# Patient Record
Sex: Male | Born: 1990 | Race: Black or African American | Hispanic: No | Marital: Single | Smoking: Current every day smoker
Health system: Southern US, Community
[De-identification: ages and names within clinical notes are randomized; demographics above are authoritative.]

---
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
## 2015-02-16 ENCOUNTER — Emergency Department (HOSPITAL_COMMUNITY): Payer: BLUE CROSS/BLUE SHIELD

## 2015-02-16 ENCOUNTER — Encounter (HOSPITAL_COMMUNITY): Payer: Self-pay | Admitting: Emergency Medicine

## 2015-02-16 ENCOUNTER — Emergency Department (HOSPITAL_COMMUNITY)
Admission: EM | Admit: 2015-02-16 | Discharge: 2015-02-17 | Disposition: A | Discharge status: Home / Self Care | Payer: BLUE CROSS/BLUE SHIELD | Attending: Emergency Medicine | Admitting: Emergency Medicine

## 2015-02-16 DIAGNOSIS — R1030 Lower abdominal pain, unspecified: Secondary | ICD-10-CM | POA: Diagnosis present

## 2015-02-16 DIAGNOSIS — K529 Noninfective gastroenteritis and colitis, unspecified: Secondary | ICD-10-CM | POA: Diagnosis not present

## 2015-02-16 LAB — CBC WITH DIFFERENTIAL/PLATELET
BASOS PCT: 0 % (ref 0–1)
Basophils Absolute: 0 10*3/uL (ref 0.0–0.1)
EOS PCT: 3 % (ref 0–5)
Eosinophils Absolute: 0.4 10*3/uL (ref 0.0–0.7)
HEMATOCRIT: 46.2 % (ref 39.0–52.0)
Hemoglobin: 16.1 g/dL (ref 13.0–17.0)
LYMPHS PCT: 19 % (ref 12–46)
Lymphs Abs: 2.6 10*3/uL (ref 0.7–4.0)
MCH: 32.7 pg (ref 26.0–34.0)
MCHC: 34.8 g/dL (ref 30.0–36.0)
MCV: 93.9 fL (ref 78.0–100.0)
MONOS PCT: 8 % (ref 3–12)
Monocytes Absolute: 1 10*3/uL (ref 0.1–1.0)
NEUTROS ABS: 9.5 10*3/uL — AB (ref 1.7–7.7)
Neutrophils Relative %: 70 % (ref 43–77)
Platelets: 160 10*3/uL (ref 150–400)
RBC: 4.92 MIL/uL (ref 4.22–5.81)
RDW: 11.9 % (ref 11.5–15.5)
WBC: 13.6 10*3/uL — ABNORMAL HIGH (ref 4.0–10.5)

## 2015-02-16 LAB — URINALYSIS, ROUTINE W REFLEX MICROSCOPIC
GLUCOSE, UA: NEGATIVE mg/dL
Hgb urine dipstick: NEGATIVE
Ketones, ur: NEGATIVE mg/dL
Nitrite: NEGATIVE
Protein, ur: NEGATIVE mg/dL
Specific Gravity, Urine: 1.036 — ABNORMAL HIGH (ref 1.005–1.030)
Urobilinogen, UA: 2 mg/dL — ABNORMAL HIGH (ref 0.0–1.0)
pH: 6.5 (ref 5.0–8.0)

## 2015-02-16 LAB — COMPREHENSIVE METABOLIC PANEL
ALBUMIN: 4.7 g/dL (ref 3.5–5.0)
ALT: 9 U/L — AB (ref 17–63)
AST: 19 U/L (ref 15–41)
Alkaline Phosphatase: 60 U/L (ref 38–126)
Anion gap: 8 (ref 5–15)
BUN: 11 mg/dL (ref 6–20)
CALCIUM: 9.5 mg/dL (ref 8.9–10.3)
CO2: 30 mmol/L (ref 22–32)
Chloride: 103 mmol/L (ref 101–111)
Creatinine, Ser: 1.17 mg/dL (ref 0.61–1.24)
GFR calc non Af Amer: >60 mL/min (ref >60)
GLUCOSE: 98 mg/dL (ref 65–99)
POTASSIUM: 4 mmol/L (ref 3.5–5.1)
SODIUM: 141 mmol/L (ref 135–145)
Total Bilirubin: 1.8 mg/dL — ABNORMAL HIGH (ref 0.3–1.2)
Total Protein: 7.7 g/dL (ref 6.5–8.1)

## 2015-02-16 LAB — URINE MICROSCOPIC-ADD ON

## 2015-02-16 LAB — LIPASE, BLOOD: Lipase: 29 U/L (ref 22–51)

## 2015-02-16 MED ORDER — ONDANSETRON HCL 4 MG/2ML IJ SOLN
4.0000 mg | Freq: Once | INTRAMUSCULAR | Status: AC
  Administered 2015-02-16: 4 mg via INTRAVENOUS
  Filled 2015-02-16: qty 2

## 2015-02-16 MED ORDER — IOHEXOL 300 MG/ML  SOLN
100.0000 mL | Freq: Once | INTRAMUSCULAR | Status: AC | PRN
  Administered 2015-02-16: 80 mL via INTRAVENOUS

## 2015-02-16 MED ORDER — SODIUM CHLORIDE 0.9 % IV BOLUS (SEPSIS)
1000.0000 mL | Freq: Once | INTRAVENOUS | Status: AC
  Administered 2015-02-16: 1000 mL via INTRAVENOUS

## 2015-02-16 MED ORDER — MORPHINE SULFATE 4 MG/ML IJ SOLN
4.0000 mg | Freq: Once | INTRAMUSCULAR | Status: AC
Start: 2015-02-16 — End: 2015-02-16
  Administered 2015-02-16: 4 mg via INTRAVENOUS
  Filled 2015-02-16: qty 1

## 2015-02-16 MED ORDER — IOHEXOL 300 MG/ML  SOLN
25.0000 mL | Freq: Once | INTRAMUSCULAR | Status: AC | PRN
Start: 2015-02-16 — End: 2015-02-16
  Administered 2015-02-16: 25 mL via ORAL

## 2015-02-16 NOTE — ED Notes (Signed)
Pt c/o low abd pain x 2 days.  States he has "always had trouble with his stomach" but it has been worse in the last 6 months.  Mom reports noticing pt's weight loss over the last 6 months.

## 2015-02-16 NOTE — ED Provider Notes (Signed)
CSN: [id number]     Arrival date & time 02/16/15  1758 History   First MD Initiated Contact with Patient 02/16/15 2125     Chief Complaint  Patient presents with  . Abdominal Pain     (Consider location/radiation/quality/duration/timing/severity/associated sxs/prior Treatment) Patient is a 24 y.o. male presenting with abdominal pain. The history is provided by the patient, medical records and a parent. No language interpreter was used.  Abdominal Pain Associated symptoms: no chest pain, no constipation, no cough, no diarrhea, no dysuria, no fatigue, no fever, no hematuria, no nausea, no shortness of breath and no vomiting      Pedro Ortiz is a 24 y.o. male  with no major medical problems presents to the Emergency Department complaining of gradual, persistent, waxing and waning lower abdominal pain onset 2 days ago.  Pt states the pain feels like a pressure, rated at a 8/10 and nonradiating.  Pt reports he has been able to eat but has had a decreased appetite.  Nausea without vomiting or diarrhea.  Pt reports that he is lactose intolerant and he still eats some milk which causes some pain.  Mother reports that she believes the patient has lost weight in the last 6 months after moving out of the house.  She reports he has lost approx 10 lbs over the last few months.  Mother reports associated symptoms include rashes to the back of his neck for the last 4-5 months. Sleeping makes his pain better and nothing makes it worse.  Pt denies fever, chills, headache, neck pain, chest pain, SOB, weakness, dysuria, hematuria, penile discharge.  Pt reports 1 male sexual partner in the last 6 months.  He reports a hx of chlamydia in April and it was treated.   Pt reports his last BM was today and it was normal; no change in pain afterwards.     History reviewed. No pertinent past medical history. No past surgical history on file. No family history on file. History  Substance Use Topics  . Smoking  status: Never Smoker   . Smokeless tobacco: Not on file  . Alcohol Use: No    Review of Systems  Constitutional: Negative for fever, diaphoresis, appetite change, fatigue and unexpected weight change.  HENT: Negative for mouth sores.   Eyes: Negative for visual disturbance.  Respiratory: Negative for cough, chest tightness, shortness of breath and wheezing.   Cardiovascular: Negative for chest pain.  Gastrointestinal: Positive for abdominal pain. Negative for nausea, vomiting, diarrhea and constipation.  Endocrine: Negative for polydipsia, polyphagia and polyuria.  Genitourinary: Negative for dysuria, urgency, frequency and hematuria.  Musculoskeletal: Negative for back pain and neck stiffness.  Skin: Negative for rash.  Allergic/Immunologic: Negative for immunocompromised state.  Neurological: Negative for syncope, light-headedness and headaches.  Hematological: Does not bruise/bleed easily.  Psychiatric/Behavioral: Negative for sleep disturbance. The patient is not nervous/anxious.       Allergies  Review of patient's allergies indicates no known allergies.  Home Medications   Prior to Admission medications   Medication Sig Start Date End Date Taking? Authorizing Provider  bismuth subsalicylate (PEPTO BISMOL) 262 MG/15ML suspension Take 30 mLs by mouth every 6 (six) hours as needed for indigestion (indigestion).   Yes Historical Provider, MD  ciprofloxacin (CIPRO) 500 MG tablet Take 1 tablet (500 mg total) by mouth 2 (two) times daily. One po bid x 7 days 02/17/15   Dahlia Client Myiesha Edgar, PA-C  HYDROcodone-acetaminophen (NORCO/VICODIN) 5-325 MG per tablet Take 1-2 tablets by mouth every 6 (  six) hours as needed for moderate pain or severe pain. 02/17/15   Kaycee Mcgaugh, PA-C  metroNIDAZOLE (FLAGYL) 500 MG tablet Take 1 tablet (500 mg total) by mouth 3 (three) times daily. 02/17/15   Sewaren Cohick, PA-C   BP 116/74 mmHg  Pulse 57  Temp(Src) 98.1 F (36.7 C) (Oral)  Resp  16  SpO2 100% Physical Exam  Constitutional: He appears well-developed and well-nourished. No distress.  Awake, alert, nontoxic appearance  HENT:  Head: Normocephalic and atraumatic.  Mouth/Throat: Oropharynx is clear and moist. No oropharyngeal exudate.  Eyes: Conjunctivae are normal. No scleral icterus.  Neck: Normal range of motion. Neck supple.  Cardiovascular: Normal rate, regular rhythm, normal heart sounds and intact distal pulses.   No murmur heard. Pulmonary/Chest: Effort normal and breath sounds normal. No respiratory distress. He has no wheezes.  Equal chest expansion  Abdominal: Soft. Bowel sounds are normal. He exhibits no distension and no mass. There is tenderness in the right lower quadrant, suprapubic area and left lower quadrant. There is guarding. There is no rebound and no CVA tenderness. Hernia confirmed negative in the right inguinal area and confirmed negative in the left inguinal area.  Abdomen soft with guarding in the bilateral lower quadrants and in the suprapubic region No peritoneal signs  Genitourinary: Testes normal and penis normal. Cremasteric reflex is present. Circumcised.  Musculoskeletal: Normal range of motion. He exhibits no edema.  Lymphadenopathy:       Right: No inguinal adenopathy present.       Left: No inguinal adenopathy present.  Neurological: He is alert.  Speech is clear and goal oriented Moves extremities without ataxia  Skin: Skin is warm and dry. He is not diaphoretic.  Psychiatric: He has a normal mood and affect.  Nursing note and vitals reviewed.   ED Course  Procedures (including critical care time) Labs Review Labs Reviewed  CBC WITH DIFFERENTIAL/PLATELET - Abnormal; Notable for the following:    WBC 13.6 (*)    Neutro Abs 9.5 (*)    All other components within normal limits  COMPREHENSIVE METABOLIC PANEL - Abnormal; Notable for the following:    ALT 9 (*)    Total Bilirubin 1.8 (*)    All other components within normal  limits  URINALYSIS, ROUTINE W REFLEX MICROSCOPIC (NOT AT [facility]) - Abnormal; Notable for the following:    Color, Urine AMBER (*)    Specific Gravity, Urine 1.036 (*)    Bilirubin Urine SMALL (*)    Urobilinogen, UA 2.0 (*)    Leukocytes, UA TRACE (*)    All other components within normal limits  URINE MICROSCOPIC-ADD ON - Abnormal; Notable for the following:    Crystals CA OXALATE CRYSTALS (*)    All other components within normal limits  LIPASE, BLOOD    Imaging Review Ct Abdomen Pelvis W Contrast  02/17/2015   CLINICAL DATA:  Acute onset of intermittent mid and lower abdominal pain for 2 days. Nausea. 10 pounds of weight loss over the past 2 months. Initial encounter.  EXAM: CT ABDOMEN AND PELVIS WITH CONTRAST  TECHNIQUE: Multidetector CT imaging of the abdomen and pelvis was performed using the standard protocol following bolus administration of intravenous contrast.  CONTRAST:  80mL OMNIPAQUE IOHEXOL 300 MG/ML  SOLN  COMPARISON:  None.  FINDINGS: The visualized lung bases are clear.  The liver and spleen are unremarkable in appearance. The gallbladder is within normal limits. The pancreas and adrenal glands are unremarkable.  The kidneys are unremarkable in appearance.  There is no evidence of hydronephrosis. No renal or ureteral stones are seen. No perinephric stranding is appreciated.  No free fluid is identified. The small bowel is unremarkable in appearance. The stomach is within normal limits. No acute vascular abnormalities are seen.  The appendix contains contrast and air, and is unremarkable in appearance. There is no evidence for appendicitis.  There is significant mucosal edema along the cecum and proximal ascending colon, raising concern for acute infectious or inflammatory colitis. The remainder of the colon is unremarkable in appearance.  The bladder is mildly distended and grossly unremarkable. The prostate remains normal in size. No inguinal lymphadenopathy is seen.  No acute osseous  abnormalities are identified.  IMPRESSION: Significant mucosal edema along the cecum and proximal ascending colon, raising concern for an acute infectious or inflammatory colitis. Malignancy is considered very unlikely given the patient's age, though would correlate with the patient's symptoms. Colonoscopy could be considered if deemed clinically appropriate.   Electronically Signed   By: Roanna Raider M.D.   On: 02/17/2015 00:55     EKG Interpretation None      MDM   Final diagnoses:  Lower abdominal pain  Colitis   Pedro Ortiz presents with lower abdomainl pain x2 days with nausea and anorexia.  Patient with guarding on exam in the lower quadrants. No rebound or peritoneal signs. Labs are reassuring however patient does have leukocytosis. Will team CT scan to rule out appendicitis. No evidence of urinary tract infection. Normal genital exam.  1:12 AM CT with mucosal edema along the cecum and proximal ascending colon, raising concern for an acute infectious or inflammatory colitis.  With patient's history of weight loss and intermittent longstanding abd discomfort, question possible diagnosis of Crohn's.  Pt will need GI follow-up.  Will be d/c home with Vicodin, Cipro and flagyl.    Patient is nontoxic, nonseptic appearing, in no apparent distress.  Patient's pain and other symptoms adequately managed in emergency department.  Fluid bolus given.  Labs, imaging and vitals reviewed.  Patient does not meet the SIRS or Sepsis criteria.  On repeat exam patient does not have a surgical abdomin and there are no peritoneal signs.  No indication of appendicitis, bowel obstruction, bowel perforation, cholecystitis, diverticulitis.  Patient and mother expresses understanding and agrees with plan.  BP 116/74 mmHg  Pulse 57  Temp(Src) 98.1 F (36.7 C) (Oral)  Resp 16  SpO2 100%     Dierdre Forth, PA-C 02/17/15 0115  Pricilla Loveless, MD 02/21/15 1437

## 2015-02-16 NOTE — ED Notes (Addendum)
Pt c/o intermittent middle and lower abdominal pain x 2days.  Pt states he has nausea but denies vomiting.  Pt denies bowel and urinary complaints.  Pt states he has had intermittent problems with stomach pain for many years and was diagnosed as lactose intolerant.  Pt's mother states pt has lost approx 10lbs over the past 30mos.  Pt reports he has been eating less recently. Mother also reports pt has "been breaking out in a lot of rashes recently".

## 2015-02-17 ENCOUNTER — Telehealth: Payer: Self-pay | Admitting: Internal Medicine

## 2015-02-17 MED ORDER — METRONIDAZOLE 500 MG PO TABS
500.0000 mg | ORAL_TABLET | Freq: Once | ORAL | Status: AC
  Administered 2015-02-17: 500 mg via ORAL
  Filled 2015-02-17: qty 1

## 2015-02-17 MED ORDER — METRONIDAZOLE 500 MG PO TABS: 500.0000 mg | ORAL_TABLET | Freq: Three times a day (TID) | ORAL | Status: DC

## 2015-02-17 MED ORDER — HYDROCODONE-ACETAMINOPHEN 5-325 MG PO TABS: 1.0000 | ORAL_TABLET | Freq: Four times a day (QID) | ORAL | Status: DC | PRN

## 2015-02-17 MED ORDER — CIPROFLOXACIN HCL 500 MG PO TABS
500.0000 mg | ORAL_TABLET | Freq: Once | ORAL | Status: AC
  Administered 2015-02-17: 500 mg via ORAL
  Filled 2015-02-17: qty 1

## 2015-02-17 MED ORDER — CIPROFLOXACIN HCL 500 MG PO TABS: 500.0000 mg | ORAL_TABLET | Freq: Two times a day (BID) | ORAL | Status: DC

## 2015-02-17 NOTE — Telephone Encounter (Signed)
Pt scheduled to see Amy Esterwood PA 03/02/15@3pm . Pt aware of appt.

## 2015-02-17 NOTE — Discharge Instructions (Signed)
1. Medications: cipro, flagyl, vicodin, usual home medications 2. Treatment: rest, drink plenty of fluids, advance diet slowly 3. Follow Up: Please followup with your primary doctor in 2 days for discussion of your diagnoses and further evaluation after today's visit; if you do not have a primary care doctor use the resource guide provided to find one; Please return to the ER for persistent vomiting, high fevers or worsening symptoms  Colitis Colitis is inflammation of the colon. Colitis can be a short-term or long-standing (chronic) illness. Crohn's disease and ulcerative colitis are 2 types of colitis which are chronic. They usually require lifelong treatment. CAUSES  There are many different causes of colitis, including:  Viruses.  Germs (bacteria).  Medicine reactions. SYMPTOMS   Diarrhea.  Intestinal bleeding.  Pain.  Fever.  Throwing up (vomiting).  Tiredness (fatigue).  Weight loss.  Bowel blockage. DIAGNOSIS  The diagnosis of colitis is based on examination and stool or blood tests. X-rays, CT scan, and colonoscopy may also be needed. TREATMENT  Treatment may include:  Fluids given through the vein (intravenously).  Bowel rest (nothing to eat or drink for a period of time).  Medicine for pain and diarrhea.  Medicines (antibiotics) that kill germs.  Cortisone medicines.  Surgery. HOME CARE INSTRUCTIONS   Get plenty of rest.  Drink enough water and fluids to keep your urine clear or pale yellow.  Eat a well-balanced diet.  Call your caregiver for follow-up as recommended. SEEK IMMEDIATE MEDICAL CARE IF:   You develop chills.  You have an oral temperature above 102 F (38.9 C), not controlled by medicine.  You have extreme weakness, fainting, or dehydration.  You have repeated vomiting.  You develop severe belly (abdominal) pain or are passing bloody or tarry stools. MAKE SURE YOU:   Understand these instructions.  Will watch your  condition.  Will get help right away if you are not doing well or get worse. Document Released: 09/19/2004 Document Revised: 11/04/2011 Document Reviewed: 12/15/2009 [facility] Patient Information 2015 [place], [place]. This information is not intended to replace advice given to you by your health care provider. Make sure you discuss any questions you have with your health care provider.    Emergency Department Resource Guide 1) Find a Doctor and Pay Out of Pocket Although you won't have to find out who is covered by your insurance plan, it is a good idea to ask around and get recommendations. You will then need to call the office and see if the doctor you have chosen will accept you as a new patient and what types of options they offer for patients who are self-pay. Some doctors offer discounts or will set up payment plans for their patients who do not have insurance, but you will need to ask so you aren't surprised when you get to your appointment.  2) Contact Your Local Health Department Not all health departments have doctors that can see patients for sick visits, but many do, so it is worth a call to see if yours does. If you don't know where your local health department is, you can check in your phone book. The CDC also has a tool to help you locate your state's health department, and many state websites also have listings of all of their local health departments.  3) Find a Walk-in Clinic If your illness is not likely to be very severe or complicated, you may want to try a walk in clinic. These are popping up all over the country in  pharmacies, drugstores, and shopping centers. They're usually staffed by nurse practitioners or physician assistants that have been trained to treat common illnesses and complaints. They're usually fairly quick and inexpensive. However, if you have serious medical issues or chronic medical problems, these are probably not your best option.  No Primary Care  Doctor: - Call Health Connect at  [phone] - they can help you locate a primary care doctor that  accepts your insurance, provides certain services, etc. - Physician Referral Service- [phone]  Chronic Pain Problems: Organization         Address  Phone   Notes  [facility]  [phone] Patients need to be referred by their primary care doctor.   Medication Assistance: Organization         Address  Phone   Notes  [facility] Medication [facility] [address] [place] [place], [place] [address] [phone] --Must be a resident of [facility] -- Must have NO insurance coverage whatsoever (no Medicaid/ Medicare, etc.) -- The pt. MUST have a primary care doctor that directs their care regularly and follows them in the community   [facility]  [phone]   [employer]  [phone]    Agencies that provide inexpensive medical care: Organization         Address  Phone   Notes  [facility]  [phone]   [facility]    [phone]   [facility] [address] [place], [place] [address] [phone]   [facility] [address] [place] [place] [phone]   Planned Parenthood    [phone]   [facility]    [phone]   [facility]  [address], [place] Phone:  [phone], Fax:  [phone] Hours of Operation:  9 am - 6 pm, M-F.  Also accepts Medicaid/Medicare and self-pay.  [facility]  [address], [place] Phone: [phone], Fax: [phone]. Hours of Operation:  8:30 am - 5:30 pm, M-F.  Also accepts Medicaid and self-pay.  [facility] [place] [address] [place] Phone: [phone]   [facility] [address] Natasha Bence [place], [place] [phone], Ext. [phone] Mondays & Thursdays: 7-9 AM.  First 15 patients are seen on a first  come, first serve basis.    Medicaid-accepting [facility] Providers:  Organization         Address  Phone   Notes  [facility] [address], [place] [phone] Also accepts self-pay patients.  [facility] [address] Laurell Josephs [place], [place]  [phone]   [facility] [address], [place] [phone]   [facility] [address], [place] [phone]   Renaye Rakers [address], [place]   [phone] Only accepts [place] patients after they have their name applied to their card.   Self-Pay (no insurance) in [facility]:  Organization         Address  Phone   Notes  Sickle Cell Patients, [facility] [address] [place], [place] [phone]   [facility] [address] [place], [place] [phone]   [facility] [place]  [address],  [place] [phone]   [facility]/Dr. Osei-Bonsu  [address], [place] or [address], [place] [phone] Phone number for both [place] and [place] locations is the same.  [facility] [address] [place] [phone]   [facility] [address] [place] or [address] [phone]   [facility] [address] [place] [phone], phone; [phone], fax Sees patients 1st and 3rd Saturday of every month.  Must not qualify for public or private insurance (i.e. Medicaid, Medicare, [place] Health Choice, Veterans' Benefits)  Household income should be no more than 200% of the poverty level The clinic cannot treat you if you are pregnant or think you are pregnant  Sexually transmitted diseases are not treated at the clinic.    Dental Care: Organization          Address  Phone  Notes  [facility] Department of [facility] [address] [place], [place] [phone] Accepts children up to age 36 who are enrolled in [place] or [place] Health Choice; pregnant women with a Medicaid card; and children who have applied for Medicaid or [place] Health Choice, but were declined, whose parents can pay a reduced fee at time of service.  [facility] Department of [facility]  [address], [place] [phone] Accepts children up to age 14 who are enrolled in [place] or [place] Health Choice; pregnant women with a Medicaid card; and children who have applied for Medicaid or  Health Choice, but were declined, whose parents can pay a reduced fee at time of service.  [facility]  [address] [place], [place] [phone] Patients are seen by appointment only. Walk-ins are not accepted. [facility] will see patients 47 years of age and older. Monday - Tuesday (8am-5pm) Most Wednesdays (8:30-5pm) $30 per visit, cash only  [facility]  [address], [facility] [phone] Patients are seen by appointment only. Walk-ins are not accepted. [facility] will see patients 29 years of age and older. One Wednesday Evening (Monthly: Volunteer Based).  $30 per visit, cash only  [employer]  [phone] for adults; Children under age 10, call Graduate Pediatric Dentistry at [phone]. Children aged 10-14, please call [phone] to request a pediatric application.  Dental services are provided in all areas of dental care including fillings, crowns and bridges, complete and partial dentures, implants, gum treatment, root canals, and extractions. Preventive care is also provided. Treatment is provided to both adults and children. Patients are selected via a lottery and there is often a waiting list.   [facility] [address] [place]  [phone] [email or web address]   [facility] [address] [place], [place] [phone], Ext. [phone] Second and Fourth Thursday of each month, opens at 6:30 AM; Clinic ends at 9 AM.  Patients are seen on a first-come first-served basis, and a limited number are seen during each clinic.   [facility]  [address] [place], [place] [phone]   Eligibility Requirements You must have lived in [place], [place], or [place] counties for at least the last three months.   You cannot be eligible for state or federal sponsored [place], including [employer], [place], or [employer].   You generally cannot  be eligible for healthcare insurance through your employer.    How to apply: Eligibility screenings are held every Tuesday and Wednesday afternoon from 1:00 pm until 4:00 pm. You do not need an appointment for the interview!  [facility] [address], [place], [place] [phone]   [facility]  [phone]   [facility]  [phone]   [facility]  [phone]    Behavioral Health Resources in the Community: Intensive Outpatient Programs Organization         Address  Phone  Notes  [facility] [address], [place], [place] [phone]   [facility] [address], [place], [place] [phone]   [facility] [address], [place], [place]  [phone]   [facility] [address],  [place], [place] [phone] or [phone]   Substance Abuse Resources Organization         Address  Phone  Notes  [facility]  [phone]   [facility]  [phone]   [facility]  [phone]   [facility]  [phone]   Residential & Outpatient Substance Abuse Program  [phone]   Psychological  Services Organization         Address  Phone  Notes  [facility]  [phone]- [phone]   [facility]  [phone]- [phone]   [facility] [address], [place] [phone] or [phone]    Mobile Crisis Teams Organization         Address  Phone  Notes  [facility], Mobile Crisis Care Unit  [phone]   [facility]  [address] [place], [place] [phone]   Doristine LocksSharon DeEsch [address] [place] [phone]    Self-Help/Support Groups Organization         Address  Phone             Notes  [facility] [place] - variety of support groups  [phone] Call for more information  Narcotics Anonymous (NA), [facility] [address], [place]  2 meetings at this location   Statisticianesidential Treatment Programs Organization         Address  Phone  Notes  [facility] [address],    [place]  [phone]   [facility]  [address], [place] [phone], [place], [place] [phone]   [facility] [address], [place] [phone] Admissions: 8am-3pm M-F  [facility] [address]    [place], [place] [phone]   [facility] [address], [place], [place] [phone]   [facility] [address]  [place], [place] [phone]   [facility] - Intensive Outpatient [address], [place], [place] [phone]   [facility] ([facility] [address]  [place], [place] [phone] or [phone]   [facility] ([facility]) [address] [place], [place] [phone] Accepts Medicaid  [facility] [address]  [place] [phone] Substance Abuse/Addiction Treatment   [facility] Resources Organization         Address  Phone  Notes  [facility]  [phone]  [phone]   Angie FavaJulie Brannon, PhD [address] [place] [place], [place]   [phone] or [phone]   [facility]   [address]  [place], [place] [phone]   [facility] [address] [place], [place] [phone] Insurance/Medicaid/sponsorship through [facility] and Families [address], [address] [place], [place] [phone] [place], [place] [phone]    Dr. Adele Schilder  [phone]   [facility]. 1) [address], [place] 2) [place] 3)  [place] [address], [place] [phone]  [phone]   [place] [phone] or [phone] (After Hours)

## 2015-03-02 ENCOUNTER — Encounter: Payer: Self-pay | Admitting: Physician Assistant

## 2015-03-02 ENCOUNTER — Ambulatory Visit (INDEPENDENT_AMBULATORY_CARE_PROVIDER_SITE_OTHER): Payer: BLUE CROSS/BLUE SHIELD | Admitting: Physician Assistant

## 2015-03-02 ENCOUNTER — Other Ambulatory Visit (INDEPENDENT_AMBULATORY_CARE_PROVIDER_SITE_OTHER): Payer: BLUE CROSS/BLUE SHIELD

## 2015-03-02 VITALS — BP 90/60 | HR 80 | Ht 61.75 in | Wt 110.5 lb

## 2015-03-02 DIAGNOSIS — R634 Abnormal weight loss: Secondary | ICD-10-CM

## 2015-03-02 DIAGNOSIS — R933 Abnormal findings on diagnostic imaging of other parts of digestive tract: Secondary | ICD-10-CM

## 2015-03-02 DIAGNOSIS — R1031 Right lower quadrant pain: Secondary | ICD-10-CM | POA: Diagnosis not present

## 2015-03-02 LAB — CBC WITH DIFFERENTIAL/PLATELET
BASOS ABS: 0 10*3/uL (ref 0.0–0.1)
Basophils Relative: 0.7 % (ref 0.0–3.0)
EOS ABS: 0.5 10*3/uL (ref 0.0–0.7)
Eosinophils Relative: 8.9 % — ABNORMAL HIGH (ref 0.0–5.0)
HCT: 45.2 % (ref 39.0–52.0)
Hemoglobin: 15.2 g/dL (ref 13.0–17.0)
LYMPHS ABS: 2.2 10*3/uL (ref 0.7–4.0)
LYMPHS PCT: 40.1 % (ref 12.0–46.0)
MCHC: 33.6 g/dL (ref 30.0–36.0)
MCV: 94.7 fl (ref 78.0–100.0)
Monocytes Absolute: 0.5 10*3/uL (ref 0.1–1.0)
Monocytes Relative: 8.4 % (ref 3.0–12.0)
Neutro Abs: 2.3 10*3/uL (ref 1.4–7.7)
Neutrophils Relative %: 41.9 % — ABNORMAL LOW (ref 43.0–77.0)
Platelets: 211 10*3/uL (ref 150.0–400.0)
RBC: 4.77 Mil/uL (ref 4.22–5.81)
RDW: 12.5 % (ref 11.5–15.5)
WBC: 5.6 10*3/uL (ref 4.0–10.5)

## 2015-03-02 LAB — SEDIMENTATION RATE: Sed Rate: 0 mm/hr (ref 0–22)

## 2015-03-02 LAB — HIGH SENSITIVITY CRP: CRP, High Sensitivity: 0.18 mg/L (ref 0.000–5.000)

## 2015-03-02 NOTE — Patient Instructions (Addendum)
Please go to the basement level to have your labs drawn. You have been scheduled for a colonoscopy. Please follow written instructions given to you at your visit today.  Please pick up your prep supplies at the pharmacy or [facility].  These are over the counter items. Dulcolax, Miralax and Gatorade.  If you use inhalers (even only as needed), please bring them with you on the day of your procedure. Your physician has requested that you go to www.startemmi.com and enter the access code given to you at your visit today. This web site gives a general overview about your procedure. However, you should still follow specific instructions given to you by our office regarding your preparation for the procedure.

## 2015-03-02 NOTE — Progress Notes (Addendum)
Patient ID: Pedro Ortiz, male   DOB: June 05, 1991, 24 y.o.   MRN: [id number]   Subjective:    Patient ID: Pedro Ortiz, male    DOB: 1990/09/10, 24 y.o.   MRN: [id number]  HPI  "Pedro Ortiz"  Is a 24 year old African-American male new to GI today referred by the emergency room after recent visit. He has generally been in good health with no known chronic medical problems. He had an ER visit on 6 24 2016 with acute severe abdominal pain.Marland Kitchen He said he been having fairly bad pain for couple of days prior to that visit. CT was done which showed the small bowel to be unremarkable but he had significant edema of the cecum and proximal ascending colon consistent with an infectious versus inflammatory colitis. The WBC was 13.6, hemoglobin 16 hematocrit of 46. He was given a course of Cipro and Flagyl which she has now completed.  He feels that the medications did help him however he is still having right-sided abdominal pain. This over the past year or so he has had right-sided abdominal discomfort which usually bothers him when he gets up in the morning and sometimes is worse if he doesn't eat anything. Generally he's has no nausea or vomiting however his appetite has been decreased and he has lost about 25-30 pounds over the past 4 months. He says he can't eat larger meals probably is eating last because he is uncomfortable after eating. Lumens of been fairly regular denies problems with ongoing diarrhea and has not noted any melena or hematochezia. He denies any problems with fever chills or sweats.   his mother states that he is lactose intolerant , and has had "stomach problems since he was a child. She says as a teenager he would complain of abdominal pain intermittently. It never had any consistent problems with right-sided pain or any weight loss in the past. Patient says his normal weight is a about 150. Pedro Ortiz Family history is negative for GI diseases far as they're aware.  Review of Systems Pertinent  positive and negative review of systems were noted in the above HPI section.  All other review of systems was otherwise negative.  Outpatient Encounter Prescriptions as of 03/02/2015  Medication Sig  . acetaminophen (TYLENOL) 500 MG tablet Take 500 mg by mouth every 6 (six) hours as needed.  Marland Kitchen HYDROcodone-acetaminophen (NORCO/VICODIN) 5-325 MG per tablet Take 1-2 tablets by mouth every 6 (six) hours as needed for moderate pain or severe pain.  . [DISCONTINUED] bismuth subsalicylate (PEPTO BISMOL) 262 MG/15ML suspension Take 30 mLs by mouth every 6 (six) hours as needed for indigestion (indigestion).  . [DISCONTINUED] ciprofloxacin (CIPRO) 500 MG tablet Take 1 tablet (500 mg total) by mouth 2 (two) times daily. One po bid x 7 days  . [DISCONTINUED] metroNIDAZOLE (FLAGYL) 500 MG tablet Take 1 tablet (500 mg total) by mouth 3 (three) times daily.   No facility-administered encounter medications on file as of 03/02/2015.   No Known Allergies There are no active problems to display for this patient.  History   Social History  . Marital Status: Single    Spouse Name: N/A  . Number of Children: 1  . Years of Education: N/A   Occupational History  . forklift driver [employer]   Social History Main Topics  . Smoking status: Current Every Day Smoker -- 0.25 packs/day for 5 years    Types: Cigarettes    Start date: 03/01/2009  . Smokeless tobacco: Not on  file  . Alcohol Use: 0.6 - 1.2 oz/week    1-2 Standard drinks or equivalent per week     Comment: on weekends  . Drug Use: No  . Sexual Activity: Not on file   Other Topics Concern  . Not on file   Social History Narrative    Mr. Pedro Ortiz family history includes Hyperlipidemia in his maternal grandmother; Hypertension in his maternal grandfather and mother; Hypothyroidism in his mother.      Objective:    Filed Vitals:   03/02/15 1504  BP: 90/60  Pulse: 80    Physical Exam   Well-developed small African-American male  in no acute distress, pleasant accompanied by his mother blood pressure 90/50 pulse 80 height 5 foot 1 weight 110. HEENT; nontraumatic normocephalic EOMI PERRLA sclera anicteric , Cardiovascular; regular rate and rhythm with S1-S2 no murmur or gallop, Pulmonary; clear bilaterally, Abdomen; soft bowel sounds are present is no palpable mass or hepatosplenomegaly he is tender in the right lower quadrant and there is fullness in the right lower quadrant no guarding or rebound, Rectal; exam not done, Extremities; no clubbing cyanosis or edema skin warm and dry, Psych; mood and affect appropriate       Assessment & Plan:   #1 24 yo male with abnormal CT ,with right sided colitis 01/2015, and c/o right sided abdominal pain x several months associated  with decrease in appetite and weight loss.  Sxs improved but not resolved with empiric antibiotics  R/O IBD #2 lactose intolerance #3 smoker  Plan; repeat CBC, check ESR,CRP Schedule for Colonoscopy with Dr. Hilarie Fredrickson- procedure discussed in detail with pt and mother and he is agreeable to proceed.  Suggested small more frequent meals ,soft diet and add protein shakes to increase caloric intake We also discussed possibility of IBD, chronicity, need for medications etc     Amy Genia Harold PA-C 03/02/2015   Cc: Dr. Sherwood Gambler, MD - ED MD   Addendum: Reviewed and agree with initial management. Jerene Bears, MD

## 2015-03-02 NOTE — Addendum Note (Signed)
Addended by: Beverley FiedlerPYRTLE, Seline Enzor M on: 03/02/2015 05:09 PM   Modules accepted: Level of Service

## 2015-03-16 ENCOUNTER — Telehealth: Payer: Self-pay | Admitting: *Deleted

## 2015-03-16 ENCOUNTER — Encounter: Payer: Self-pay | Admitting: Internal Medicine

## 2015-03-16 ENCOUNTER — Ambulatory Visit (AMBULATORY_SURGERY_CENTER): Payer: BLUE CROSS/BLUE SHIELD | Admitting: Internal Medicine

## 2015-03-16 VITALS — BP 127/79 | HR 49 | Temp 96.1°F | Resp 25 | Ht 61.75 in | Wt 110.0 lb

## 2015-03-16 DIAGNOSIS — R933 Abnormal findings on diagnostic imaging of other parts of digestive tract: Secondary | ICD-10-CM

## 2015-03-16 DIAGNOSIS — R1031 Right lower quadrant pain: Secondary | ICD-10-CM | POA: Diagnosis not present

## 2015-03-16 MED ORDER — SODIUM CHLORIDE 0.9 % IV SOLN: 500.0000 mL | INTRAVENOUS | Status: DC

## 2015-03-16 NOTE — Patient Instructions (Signed)
YOU HAD AN ENDOSCOPIC PROCEDURE TODAY AT THE [facility]:   Refer to the procedure report that was given to you for any specific questions about what was found during the examination.  If the procedure report does not answer your questions, please call your gastroenterologist to clarify.  If you requested that your care partner not be given the details of your procedure findings, then the procedure report has been included in a sealed envelope for you to review at your convenience later.  YOU SHOULD EXPECT: Some feelings of bloating in the abdomen. Passage of more gas than usual.  Walking can help get rid of the air that was put into your GI tract during the procedure and reduce the bloating. If you had a lower endoscopy (such as a colonoscopy or flexible sigmoidoscopy) you may notice spotting of blood in your stool or on the toilet paper. If you underwent a bowel prep for your procedure, you may not have a normal bowel movement for a few days.  Please Note:  You might notice some irritation and congestion in your nose or some drainage.  This is from the oxygen used during your procedure.  There is no need for concern and it should clear up in a day or so.  SYMPTOMS TO REPORT IMMEDIATELY:   Following lower endoscopy (colonoscopy or flexible sigmoidoscopy):  Excessive amounts of blood in the stool  Significant tenderness or worsening of abdominal pains  Swelling of the abdomen that is new, acute  Fever of 100F or higher  For urgent or emergent issues, a gastroenterologist can be reached at any hour by calling [phone].   DIET: Your first meal following the procedure should be a small meal and then it is ok to progress to your normal diet. Heavy or fried foods are harder to digest and may make you feel nauseous or bloated.  Likewise, meals heavy in dairy and vegetables can increase bloating.  Drink plenty of fluids but you should avoid alcoholic beverages for 24  hours.  ACTIVITY:  You should plan to take it easy for the rest of today and you should NOT DRIVE or use heavy machinery until tomorrow (because of the sedation medicines used during the test).    FOLLOW UP: Our staff will call the number listed on your records the next business day following your procedure to check on you and address any questions or concerns that you may have regarding the information given to you following your procedure. If we do not reach you, we will leave a message.  However, if you are feeling well and you are not experiencing any problems, there is no need to return our call.  We will assume that you have returned to your regular daily activities without incident.  If any biopsies were taken you will be contacted by phone or by letter within the next 1-3 weeks.  Please call us at [phone] if you have not heard about the biopsies in 3 weeks.    SIGNATURES/CONFIDENTIALITY: You and/or your care partner have signed paperwork which will be entered into your electronic medical record.  These signatures attest to the fact that that the information above on your After Visit Summary has been reviewed and is understood.  Full responsibility of the confidentiality of this discharge information lies with you and/or your care-partner.  Please call to set up an appointment with Dr. Rhea Belton for 1 month

## 2015-03-16 NOTE — Progress Notes (Signed)
A/ox3 pleased with MAC, report to Kristen RN 

## 2015-03-16 NOTE — Op Note (Signed)
[facility] [address] [place], [address]   COLONOSCOPY PROCEDURE REPORT  PATIENT: Pedro, Ortiz  MR#: [id number] BIRTHDATE: 09-20-1990 , 23  yrs. old GENDER: male ENDOSCOPIST: Beverley Fiedler, MD REFERRED XB:JYNWG Swayne, M.D. PROCEDURE DATE:  03/16/2015 PROCEDURE:   Colonoscopy, diagnostic First Screening Colonoscopy - Avg.  risk and is 50 yrs.  old or older - No.  Prior Negative Screening - Now for repeat screening. N/A  History of Adenoma - Now for follow-up colonoscopy & has been > or = to 3 yrs.  N/A  Polyps removed today? No Polyps removed today? No Recommend repeat exam, <10 yrs? No ASA CLASS:   Class I INDICATIONS:abdominal pain in the lower right quadrant and an abnormal CT. MEDICATIONS: Monitored anesthesia care and Propofol 200 mg IV  DESCRIPTION OF PROCEDURE:   After the risks benefits and alternatives of the procedure were thoroughly explained, informed consent was obtained.  The digital rectal exam revealed no rectal mass.   The LB PFC-H190 [id number]  endoscope was introduced through the anus and advanced to the terminal ileum which was intubated for a short distance. No adverse events experienced.   The quality of the prep was good.  (Suprep was used)  The instrument was then slowly withdrawn as the colon was fully examined. Estimated blood loss is zero unless otherwise noted in this procedure report.    COLON FINDINGS: The examined terminal ileum appeared to be normal. A normal appearing cecum, ileocecal valve, and appendiceal orifice were identified.  The ascending, transverse, descending, sigmoid colon, and rectum appeared unremarkable.  Retroflexed views revealed external hemorrhoids. The time to cecum = 3.7 Withdrawal time = 11.1   The scope was withdrawn and the procedure completed.  COMPLICATIONS: There were no immediate complications.  ENDOSCOPIC IMPRESSION: 1.   The examined terminal ileum appeared to be normal 2.   Normal  colonoscopy  RECOMMENDATIONS: No findings to support inflammatory bowel disease.  Suspect colitis seen by CT in June was infectious in nature, now resolved.  Clinic follow-up recommended if symptoms persist  eSigned:  Beverley Fiedler, MD 03/16/2015 11:29 AM   cc: Tally Joe, MD and The Patient

## 2015-03-16 NOTE — Telephone Encounter (Signed)
Pedro Ortiz,  Dr. Rhea Belton would like this pt to have an OV for next available. I tried to set this up, but Dr. Rhea Belton doesn't have any available appointments in August or September.  Could you please set this up? Thanks, [employer]

## 2015-03-17 ENCOUNTER — Telehealth: Payer: Self-pay | Admitting: *Deleted

## 2015-03-17 NOTE — Telephone Encounter (Signed)
No answer, left message to call if questions or concerns. 

## 2015-03-20 NOTE — Telephone Encounter (Signed)
Pt scheduled to see Dr. Rhea Belton 05/12/15@2 :45pm. Letter mailed to pt.

## 2015-03-31 ENCOUNTER — Telehealth: Payer: Self-pay | Admitting: Internal Medicine

## 2015-03-31 NOTE — Telephone Encounter (Signed)
Letter left up front for pt to pickup.

## 2015-05-12 ENCOUNTER — Ambulatory Visit: Payer: BLUE CROSS/BLUE SHIELD | Admitting: Internal Medicine

## 2015-07-10 ENCOUNTER — Ambulatory Visit: Payer: BLUE CROSS/BLUE SHIELD | Admitting: Internal Medicine

## 2015-12-27 ENCOUNTER — Ambulatory Visit (INDEPENDENT_AMBULATORY_CARE_PROVIDER_SITE_OTHER): Payer: BLUE CROSS/BLUE SHIELD | Admitting: Family Medicine

## 2015-12-27 VITALS — BP 122/76 | HR 69 | Temp 98.7°F | Resp 16 | Ht 61.75 in | Wt 111.9 lb

## 2015-12-27 DIAGNOSIS — B353 Tinea pedis: Secondary | ICD-10-CM | POA: Diagnosis not present

## 2015-12-27 MED ORDER — KETOCONAZOLE 2 % EX CREA: 1.0000 "application " | TOPICAL_CREAM | Freq: Every day | CUTANEOUS | Status: DC

## 2015-12-27 NOTE — Progress Notes (Signed)
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  It gets tender.  Objective:  NAD BP 122/76 mmHg  Pulse 69  Temp(Src) 98.7 F (37.1 C) (Oral)  Resp 16  Ht 5' 1.75" (1.568 m)  Wt 111 lb 14.4 oz (50.758 kg)  BMI 20.64 kg/m2  SpO2 99% Peeling and cracking in the digital web space of both 4-5 toes.  Assessment:  Athlete's foot Tinea pedis of both feet - Plan: ketoconazole (NIZORAL) 2 % cream  Elvina SidleKurt Nereyda Bowler, MD

## 2015-12-27 NOTE — Patient Instructions (Addendum)

## 2016-07-07 IMAGING — CT CT ABD-PELV W/ CM
2 of 4 series · 16 of 46 positions shown, 18 images · IV contrast (OMNIPAQUE 300)
Comparison: None.

CLINICAL DATA: Acute onset of intermittent mid and lower abdominal
pain for 2 days. Nausea. 10 pounds of weight loss over the past 2
months. Initial encounter.

EXAM:
CT ABDOMEN AND PELVIS WITH CONTRAST
TECHNIQUE: Multidetector CT imaging of the abdomen and pelvis was performed
using the standard protocol following bolus administration of
intravenous contrast.
CONTRAST:  80mL OMNIPAQUE IOHEXOL 300 MG/ML  SOLN

[Series 2: abd/pel with · axial · 0.60mm/px · z∈[+832,+1202]mm · 13 of 82 slices shown, 15 images]
[im 4/82  soft-tissue]
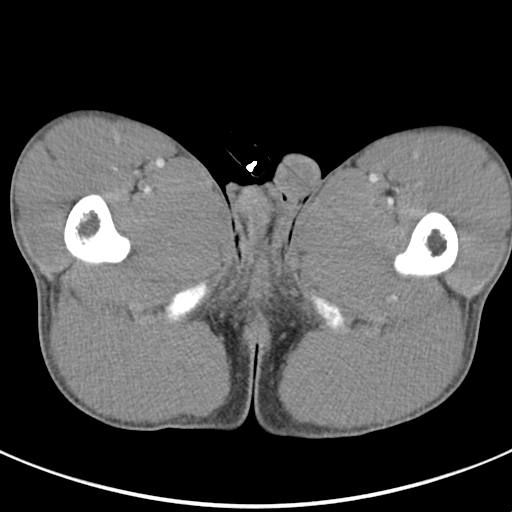
[im 4/82  bone]
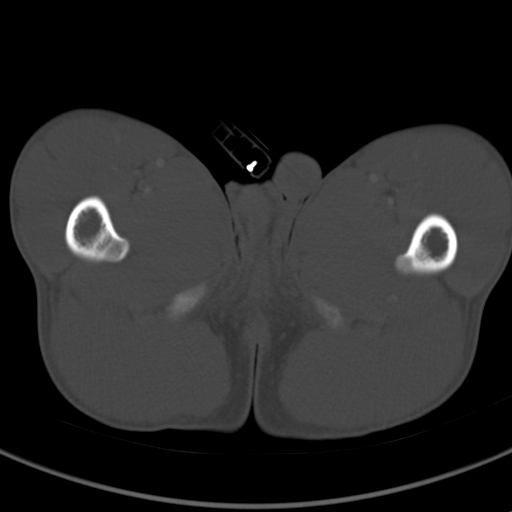
[im 11/82  soft-tissue]
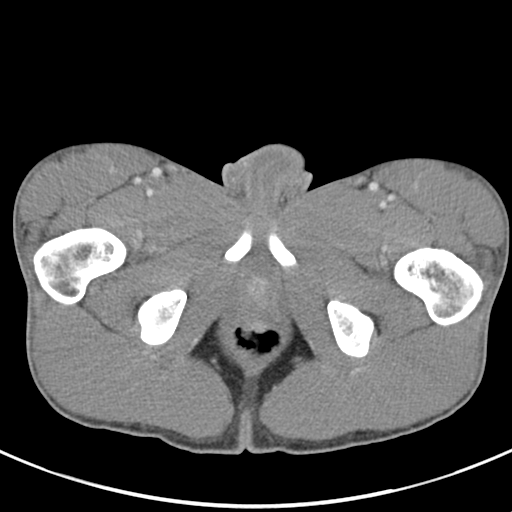
[im 17/82  soft-tissue]
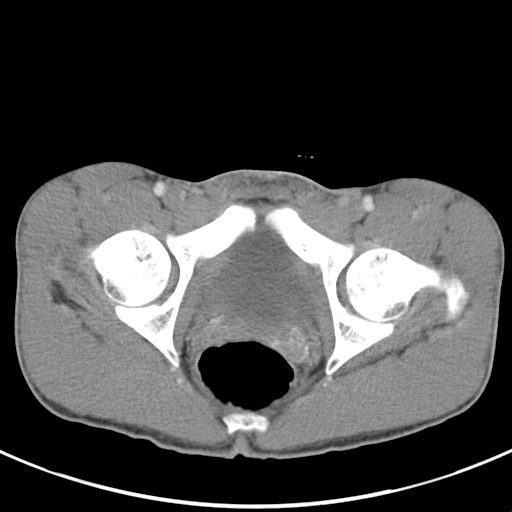
[im 24/82  soft-tissue]
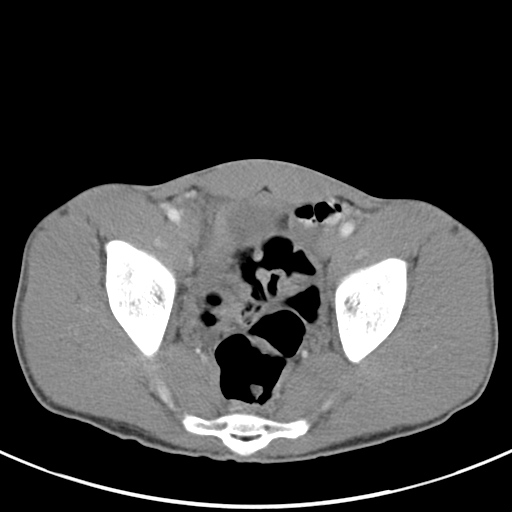
[im 28/82  soft-tissue]
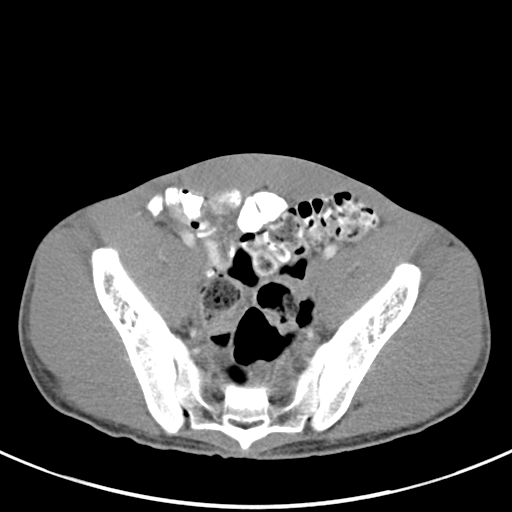
[im 34/82  soft-tissue]
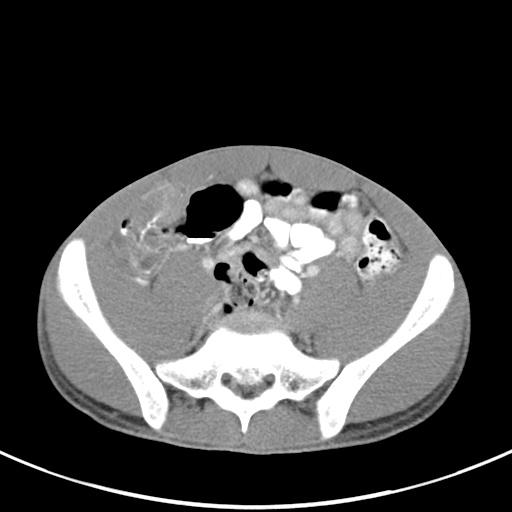
[im 41/82  soft-tissue]
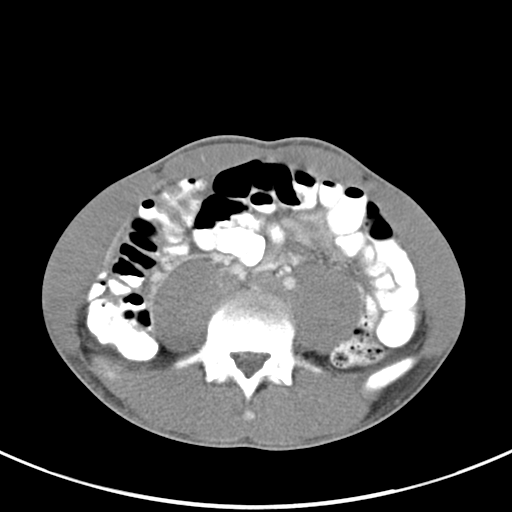
[im 48/82  soft-tissue]
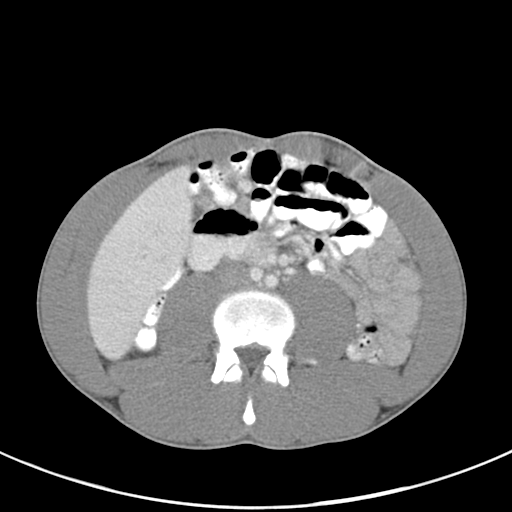
[im 55/82  soft-tissue]
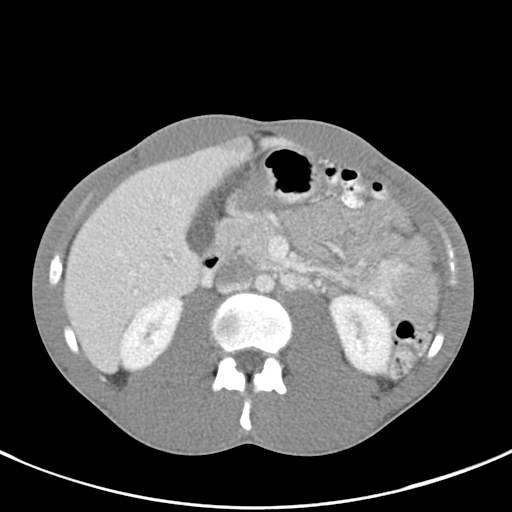
[im 55/82  bone]
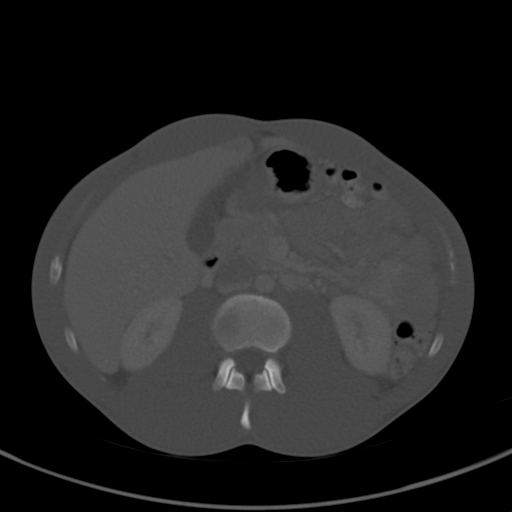
[im 58/82  soft-tissue]
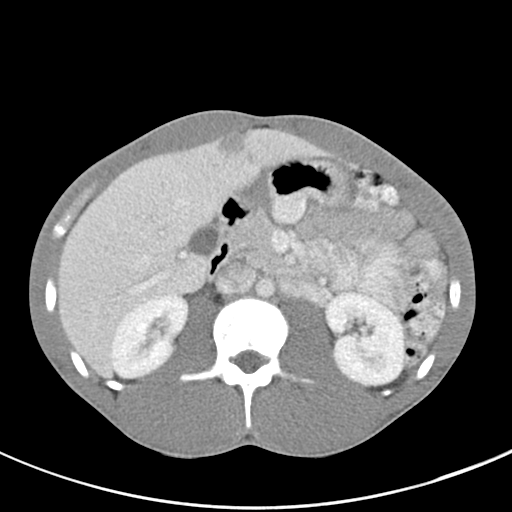
[im 65/82  soft-tissue]
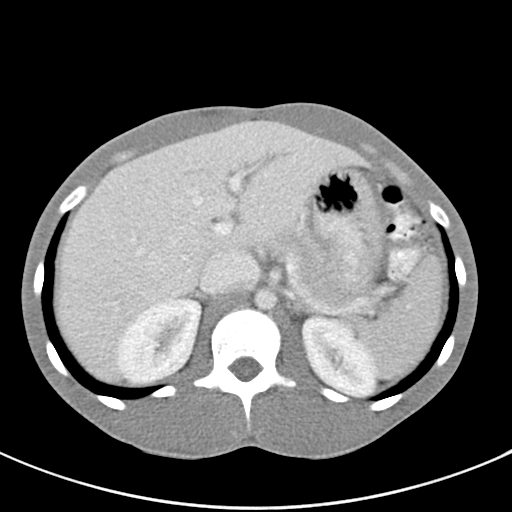
[im 71/82  soft-tissue]
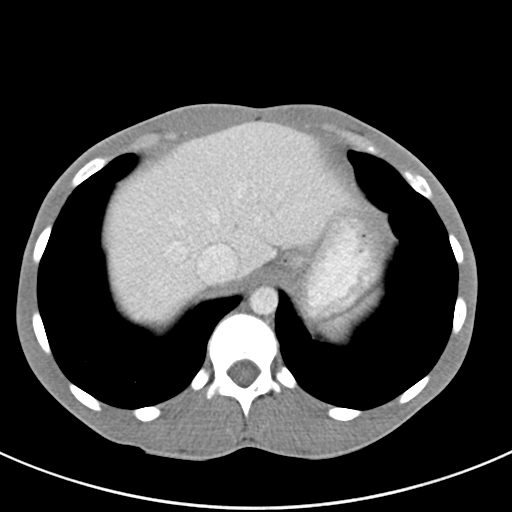
[im 78/82  soft-tissue]
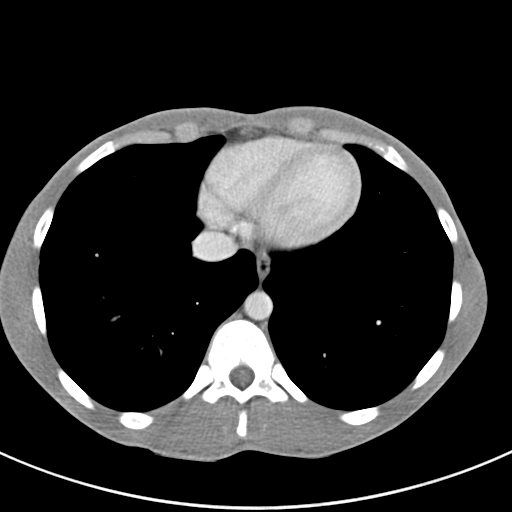

[Series 5: coronal a/|p · coronal · 0.66mm/px · 3 of 74 slices shown]
[im 25/74  soft-tissue]
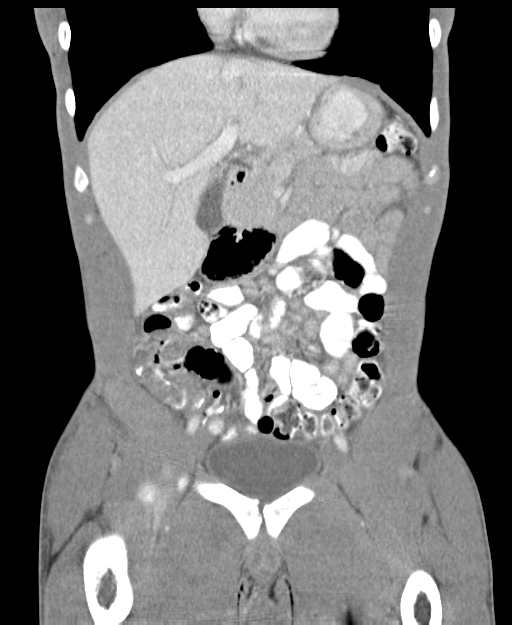
[im 33/74  soft-tissue]
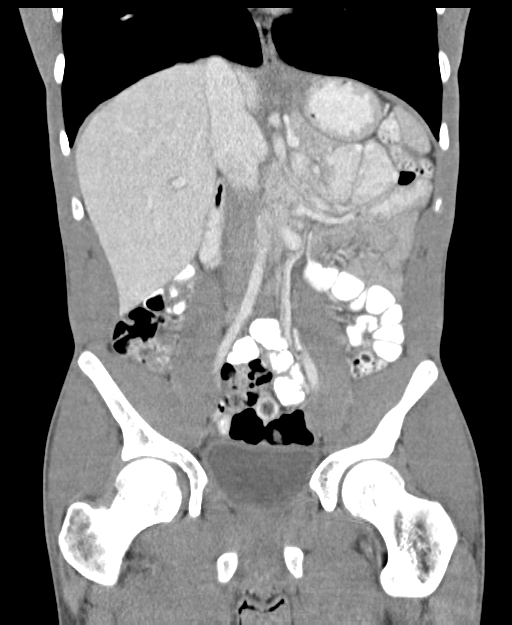
[im 41/74  soft-tissue]
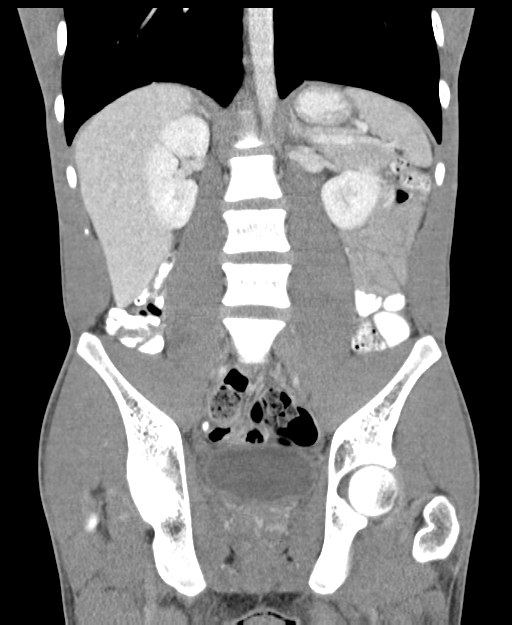

[16 of 46 positions shown; findings below may reference images not displayed]

FINDINGS: The visualized lung bases are clear.

The liver and spleen are unremarkable in appearance. The gallbladder
is within normal limits. The pancreas and adrenal glands are
unremarkable.

The kidneys are unremarkable in appearance. There is no evidence of
hydronephrosis. No renal or ureteral stones are seen. No perinephric
stranding is appreciated.

No free fluid is identified. The small bowel is unremarkable in
appearance. The stomach is within normal limits. No acute vascular
abnormalities are seen.

The appendix contains contrast and air, and is unremarkable in
appearance. There is no evidence for appendicitis.

There is significant mucosal edema along the cecum and proximal
ascending colon, raising concern for acute infectious or
inflammatory colitis. The remainder of the colon is unremarkable in
appearance.

The bladder is mildly distended and grossly unremarkable. The
prostate remains normal in size. No inguinal lymphadenopathy is
seen.

No acute osseous abnormalities are identified.
IMPRESSION: Significant mucosal edema along the cecum and proximal ascending
colon, raising concern for an acute infectious or inflammatory
colitis. Malignancy is considered very unlikely given the patient's
age, though would correlate with the patient's symptoms. Colonoscopy
could be considered if deemed clinically appropriate.

## 2018-01-13 ENCOUNTER — Other Ambulatory Visit: Payer: Self-pay

## 2018-01-13 ENCOUNTER — Encounter (HOSPITAL_COMMUNITY): Payer: Self-pay | Admitting: Emergency Medicine

## 2018-01-13 ENCOUNTER — Ambulatory Visit (HOSPITAL_COMMUNITY)
Admission: EM | Admit: 2018-01-13 | Discharge: 2018-01-13 | Disposition: A | Discharge status: Home / Self Care | Payer: BLUE CROSS/BLUE SHIELD | Attending: Family Medicine | Admitting: Family Medicine

## 2018-01-13 DIAGNOSIS — R5383 Other fatigue: Secondary | ICD-10-CM

## 2018-01-13 DIAGNOSIS — Q181 Preauricular sinus and cyst: Secondary | ICD-10-CM

## 2018-01-13 NOTE — Discharge Instructions (Signed)
If the cyst in front of your ear continues to bother you, you may see an ear, nose, and throat surgeon to discuss removal.

## 2018-01-13 NOTE — ED Triage Notes (Signed)
Pt reports an abscess in front of his left ear that has been there for about 2 months.  Pt reports he can press on it sometimes and he will either get blood or a clear drainage from it.  Yesterday he began having fatigue and a headache.

## 2018-02-04 NOTE — ED Provider Notes (Signed)
[facility]   [id number] 01/13/18 Arrival Time: 1303  ASSESSMENT & PLAN:  1. Fatigue, unspecified type   2. Preauricular cyst    ENT f/u discussed. Acute onset of fatigue. Watch closely. Question early viral illness.  Reviewed expectations re: course of current medical issues. Questions answered. Outlined signs and symptoms indicating need for more acute intervention. Patient verbalized understanding. After Visit Summary given.   SUBJECTIVE: History from: patient. Pedro Ortiz is a 27 y.o. male who presents with complaint of "a knot" on his left ear. 2 months. Gradual onset. Occasional drainage and tenderness. Afebrile. Yesterday began to feel fatigued. Mild HA. Not drinking as much fluids. Questions relation. Otherwise well. No OTC treatment. No specific aggravating or alleviating factors reported.  ROS: As per HPI.   OBJECTIVE:  Vitals:   01/13/18 1406  BP: 111/73  Pulse: 91  Temp: 99.1 F (37.3 C)  TempSrc: Oral  SpO2: 100%    General appearance: alert; no distress Eyes: PERRLA; EOMI; conjunctiva normal HENT: pre-auricular cyst on L ear; no sign of infection Neck: supple  Lungs: clear to auscultation bilaterally Heart: regular rate and rhythm Skin: warm and dry Psychological: alert and cooperative; normal mood and affect  No Known Allergies  Social History   Socioeconomic History  . Marital status: Single    Spouse name: Not on file  . Number of children: 1  . Years of education: Not on file  . Highest education level: Not on file  Occupational History  . Occupation: Barista: [employer]  Social Needs  . Financial resource strain: Not on file  . Food insecurity:    Worry: Not on file    Inability: Not on file  . Transportation needs:    Medical: Not on file    Non-medical: Not on file  Tobacco Use  . Smoking status: Current Every Day Smoker    Packs/day: 0.25    Years: 5.00    Pack years: 1.25   Types: Cigarettes    Start date: 03/01/2009  . Smokeless tobacco: Never Used  Substance and Sexual Activity  . Alcohol use: Yes    Alcohol/week: 0.6 - 1.2 oz    Types: 1 - 2 Standard drinks or equivalent per week    Comment: on weekends  . Drug use: No  . Sexual activity: Not on file  Lifestyle  . Physical activity:    Days per week: Not on file    Minutes per session: Not on file  . Stress: Not on file  Relationships  . Social connections:    Talks on phone: Not on file    Gets together: Not on file    Attends religious service: Not on file    Active member of club or organization: Not on file    Attends meetings of clubs or organizations: Not on file    Relationship status: Not on file  . Intimate partner violence:    Fear of current or ex partner: Not on file    Emotionally abused: Not on file    Physically abused: Not on file    Forced sexual activity: Not on file  Other Topics Concern  . Not on file  Social History Narrative  . Not on file   Family History  Problem Relation Age of Onset  . Hypertension Mother   . Hypothyroidism Mother   . Hypertension Maternal Grandfather   . Hyperlipidemia Maternal Grandmother   . Colon cancer Neg Hx  No past surgical history on file.   Mardella LaymanHagler, Erisa Mehlman, MD 02/04/18 [phone]

## 2019-03-31 ENCOUNTER — Emergency Department (HOSPITAL_BASED_OUTPATIENT_CLINIC_OR_DEPARTMENT_OTHER)
Admission: EM | Admit: 2019-03-31 | Discharge: 2019-03-31 | Disposition: A | Discharge status: Home / Self Care | Payer: BC Managed Care – PPO | Attending: Emergency Medicine | Admitting: Emergency Medicine

## 2019-03-31 ENCOUNTER — Encounter (HOSPITAL_BASED_OUTPATIENT_CLINIC_OR_DEPARTMENT_OTHER): Payer: Self-pay

## 2019-03-31 ENCOUNTER — Emergency Department (HOSPITAL_BASED_OUTPATIENT_CLINIC_OR_DEPARTMENT_OTHER): Payer: BC Managed Care – PPO

## 2019-03-31 ENCOUNTER — Other Ambulatory Visit: Payer: Self-pay

## 2019-03-31 DIAGNOSIS — S022XXA Fracture of nasal bones, initial encounter for closed fracture: Secondary | ICD-10-CM | POA: Diagnosis not present

## 2019-03-31 DIAGNOSIS — Y92008 Other place in unspecified non-institutional (private) residence as the place of occurrence of the external cause: Secondary | ICD-10-CM | POA: Diagnosis not present

## 2019-03-31 DIAGNOSIS — Y9389 Activity, other specified: Secondary | ICD-10-CM | POA: Diagnosis not present

## 2019-03-31 DIAGNOSIS — F17210 Nicotine dependence, cigarettes, uncomplicated: Secondary | ICD-10-CM | POA: Diagnosis not present

## 2019-03-31 DIAGNOSIS — W108XXA Fall (on) (from) other stairs and steps, initial encounter: Secondary | ICD-10-CM | POA: Diagnosis not present

## 2019-03-31 DIAGNOSIS — Y999 Unspecified external cause status: Secondary | ICD-10-CM | POA: Insufficient documentation

## 2019-03-31 DIAGNOSIS — Z79899 Other long term (current) drug therapy: Secondary | ICD-10-CM | POA: Diagnosis not present

## 2019-03-31 DIAGNOSIS — S0992XA Unspecified injury of nose, initial encounter: Secondary | ICD-10-CM | POA: Diagnosis present

## 2019-03-31 DIAGNOSIS — S0083XA Contusion of other part of head, initial encounter: Secondary | ICD-10-CM | POA: Insufficient documentation

## 2019-03-31 MED ORDER — IBUPROFEN 400 MG PO TABS
400.0000 mg | ORAL_TABLET | Freq: Once | ORAL | Status: AC
  Administered 2019-03-31: 18:00:00 400 mg via ORAL
  Filled 2019-03-31: qty 1

## 2019-03-31 NOTE — ED Triage Notes (Signed)
Pt states he tripped over his dog this am and fell down steps-swelling/bruising noted to left eye-pain to right jaw and mouth and pain to right ankle-denies LOC-NAD-steady gait

## 2019-03-31 NOTE — ED Provider Notes (Signed)
[facility] EMERGENCY DEPARTMENT Provider Note   CSN: [id number] Arrival date & time: 03/31/19  1604     History   Chief Complaint Chief Complaint  Patient presents with  . Fall    HPI Pedro Ortiz is a 28 y.o. male.     Patient is a 28 year old male with no known medical problems presenting today with injury to the face.  Patient states he was getting ready for work this morning at 5 AM when he fell down the stairs hitting his face on the floor.  He has had persistent pain and swelling in these areas but denies any visual changes, neck pain, loss of consciousness.  He has no injury to his chest, abdomen or extremities.  No difficulty swallowing or opening his mouth.  The history is provided by the patient.  Fall This is a new problem. The current episode started 6 to 12 hours ago. The problem occurs constantly. The problem has not changed since onset.Associated symptoms comments: Pain in the left eye, left cheek and right jaw.  Also feels like a tooth is loose.  No visual changes. Exacerbated by: Palpation. The symptoms are relieved by rest and ice. He has tried acetaminophen for the symptoms. The treatment provided mild relief.    History reviewed. No pertinent past medical history.  There are no active problems to display for this patient.   History reviewed. No pertinent surgical history.      Home Medications    Prior to Admission medications   Medication Sig Start Date End Date Taking? Authorizing Provider  ketoconazole (NIZORAL) 2 % cream Apply 1 application topically daily. 12/27/15   Robyn Haber, MD    Family History Family History  Problem Relation Age of Onset  . Hypertension Mother   . Hypothyroidism Mother   . Hypertension Maternal Grandfather   . Hyperlipidemia Maternal Grandmother   . Colon cancer Neg Hx     Social History Social History   Tobacco Use  . Smoking status: Current Every Day Smoker    Packs/day: 0.25    Years:  5.00    Pack years: 1.25    Types: Cigarettes    Start date: 03/01/2009  . Smokeless tobacco: Never Used  Substance Use Topics  . Alcohol use: Yes    Comment: weekends  . Drug use: No     Allergies   Patient has no known allergies.   Review of Systems Review of Systems  All other systems reviewed and are negative.    Physical Exam Updated Vital Signs BP 132/79 (BP Location: Left Arm)   Pulse 84   Temp 99 F (37.2 C) (Oral)   Resp 14   Ht 5\' 1"  (1.549 m)   Wt 57.2 kg   BMI 23.81 kg/m   Physical Exam Vitals signs and nursing note reviewed.  Constitutional:      General: He is not in acute distress.    Appearance: He is well-developed.  HENT:     Head: Normocephalic. Contusion present.     Comments: Significant swelling to the left portion of the nose, left cheek, left periorbital area with ecchymosis.  Upper and lower lips are swollen but no trismus present.  No septal deviation or hematoma    Mouth/Throat:     Mouth: Mucous membranes are moist.   Eyes:     Extraocular Movements: Extraocular movements intact.     Conjunctiva/sclera: Conjunctivae normal.     Pupils: Pupils are equal, round, and reactive to  light.  Neck:     Musculoskeletal: Normal range of motion and neck supple. No neck rigidity, spinous process tenderness or muscular tenderness.  Cardiovascular:     Rate and Rhythm: Normal rate and regular rhythm.     Heart sounds: No murmur.  Pulmonary:     Effort: Pulmonary effort is normal. No respiratory distress.     Breath sounds: Normal breath sounds. No wheezing or rales.  Abdominal:     General: There is no distension.     Palpations: Abdomen is soft.     Tenderness: There is no abdominal tenderness. There is no guarding or rebound.  Musculoskeletal: Normal range of motion.        General: No tenderness.  Skin:    General: Skin is warm and dry.     Findings: No erythema or rash.  Neurological:     Mental Status: He is alert and oriented to  person, place, and time. Mental status is at baseline.  Psychiatric:        Mood and Affect: Mood normal.        Behavior: Behavior normal.        Thought Content: Thought content normal.      ED Treatments / Results  Labs (all labs ordered are listed, but only abnormal results are displayed) Labs Reviewed - No data to display  EKG None  Radiology Ct Maxillofacial Wo Contrast  Result Date: 03/31/2019 CLINICAL DATA:  Right mouth and mandibular pain and left eye pain, swelling and bruising following a fall this morning. EXAM: CT MAXILLOFACIAL WITHOUT CONTRAST TECHNIQUE: Multidetector CT imaging of the maxillofacial structures was performed. Multiplanar CT image reconstructions were also generated. COMPARISON:  None. FINDINGS: Osseous: Left nasal bone fracture with mild medial displacement of the anterior fragment. Approximately 1 mm of depression. No other fractures are seen. Specifically, the mandible and TMJ joints are intact. Orbits: Negative. No traumatic or inflammatory finding. Sinuses: Mild left maxillary sinus mucosal thickening with a small amount of fluid. Multiple small right maxillary sinus retention cysts. Left sphenoid sinus retention cyst and mild mucosal thickening. Soft tissues: Left facial soft tissue swelling. Limited intracranial: No significant or unexpected finding. IMPRESSION: 1. Left nasal bone fracture, as described above. 2. Left facial soft tissue swelling. 3. 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 Electronically Signed   By: Beckie SaltsSteven  Reid M.D.   On: 03/31/2019 18:08    Procedures Procedures (including critical care time)  Medications Ordered in ED Medications  ibuprofen (ADVIL) tablet 400 mg (400 mg Oral Given 03/31/19 1822)     Initial Impression / Assessment and Plan / ED Course  I have reviewed the triage vital signs and the nursing notes.  Pertinent labs & imaging results that were available during my care of the patient were reviewed by  me and considered in my medical decision making (see chart for details).        28 year old male with significant trauma to the left side of the face and lips after falling down the stairs.  Extraocular movements are intact and vision is normal per the patient.  CT shows a left nasal bone fracture and facial contusion but no other broken bones.  Findings discussed with the patient.  He will continue ice and ibuprofen as needed for pain.  Final Clinical Impressions(s) / ED Diagnoses   Final diagnoses:  Contusion of face, initial encounter  Closed fracture of nasal bone, initial encounter    ED Discharge Orders    None  Gwyneth SproutPlunkett, Latrell Reitan, MD 03/31/19 1900

## 2019-03-31 NOTE — Discharge Instructions (Signed)
Try not to blow your nose for the next week.  If you feel congestion use Ocean nasal spray or saline spray.  You can use ice for the swelling and ibuprofen and Tylenol as needed for pain

## 2019-04-02 ENCOUNTER — Other Ambulatory Visit: Payer: Self-pay

## 2019-04-02 ENCOUNTER — Emergency Department (HOSPITAL_COMMUNITY): Payer: BC Managed Care – PPO

## 2019-04-02 ENCOUNTER — Encounter (HOSPITAL_COMMUNITY): Payer: Self-pay | Admitting: Emergency Medicine

## 2019-04-02 ENCOUNTER — Emergency Department (HOSPITAL_COMMUNITY)
Admission: EM | Admit: 2019-04-02 | Discharge: 2019-04-02 | Disposition: A | Discharge status: Home / Self Care | Payer: BC Managed Care – PPO | Attending: Emergency Medicine | Admitting: Emergency Medicine

## 2019-04-02 DIAGNOSIS — S92251A Displaced fracture of navicular [scaphoid] of right foot, initial encounter for closed fracture: Secondary | ICD-10-CM | POA: Insufficient documentation

## 2019-04-02 DIAGNOSIS — Y999 Unspecified external cause status: Secondary | ICD-10-CM | POA: Diagnosis not present

## 2019-04-02 DIAGNOSIS — Y939 Activity, unspecified: Secondary | ICD-10-CM | POA: Diagnosis not present

## 2019-04-02 DIAGNOSIS — Y929 Unspecified place or not applicable: Secondary | ICD-10-CM | POA: Diagnosis not present

## 2019-04-02 DIAGNOSIS — F17210 Nicotine dependence, cigarettes, uncomplicated: Secondary | ICD-10-CM | POA: Diagnosis not present

## 2019-04-02 DIAGNOSIS — W108XXA Fall (on) (from) other stairs and steps, initial encounter: Secondary | ICD-10-CM | POA: Insufficient documentation

## 2019-04-02 DIAGNOSIS — S92154A Nondisplaced avulsion fracture (chip fracture) of right talus, initial encounter for closed fracture: Secondary | ICD-10-CM | POA: Insufficient documentation

## 2019-04-02 DIAGNOSIS — Z79899 Other long term (current) drug therapy: Secondary | ICD-10-CM | POA: Diagnosis not present

## 2019-04-02 DIAGNOSIS — S99911A Unspecified injury of right ankle, initial encounter: Secondary | ICD-10-CM | POA: Diagnosis present

## 2019-04-02 MED ORDER — HYDROCODONE-ACETAMINOPHEN 5-325 MG PO TABS: ORAL_TABLET | ORAL | 0 refills | Status: DC

## 2019-04-02 NOTE — Discharge Instructions (Addendum)
Elevate your foot when possible.  Use the crutches for weight bearing.  Call Dr. Ruthe Mannan office on Monday to arrange a follow-up appt

## 2019-04-02 NOTE — ED Notes (Signed)
EDP at bedside  

## 2019-04-02 NOTE — ED Triage Notes (Signed)
Patient fell down some steps on Tuesday, currently having right foot and right ankle pain, swelling noted to foot.

## 2019-04-02 NOTE — ED Provider Notes (Signed)
[facility] EMERGENCY DEPARTMENT Provider Note   CSN: [id number] Arrival date & time: 04/02/19  1147     History   Chief Complaint Chief Complaint  Patient presents with  . Ankle Pain    HPI Pedro Ortiz is a 28 y.o. male.     HPI   Pedro Ortiz is a 28 y.o. male who presents to the Emergency Department complaining of pain and swelling of his right foot and ankle for 2 days.  He was initially seen at [facility] on 03/31/19 after a mechanical fall in which he fell down some steps injuring his face.  He noticed the pain in his foot the following day.  He describes the pain in his foot as throbbing and worse with weight bearing.  Pain radiates to his ankle.  He states the top of his foot appears swollen which prompted his ER return.  He states the injuries to his face appear to be healing well and the pain is subsiding.  He denies pain or swelling of his leg, numbness or weakness of the extremity, and open wounds or redness.     History reviewed. No pertinent past medical history.  There are no active problems to display for this patient.   History reviewed. No pertinent surgical history.    Home Medications    Prior to Admission medications   Medication Sig Start Date End Date Taking? Authorizing Provider  ketoconazole (NIZORAL) 2 % cream Apply 1 application topically daily. 12/27/15   Robyn Haber, MD    Family History Family History  Problem Relation Age of Onset  . Hypertension Mother   . Hypothyroidism Mother   . Hypertension Maternal Grandfather   . Hyperlipidemia Maternal Grandmother   . Colon cancer Neg Hx     Social History Social History   Tobacco Use  . Smoking status: Current Every Day Smoker    Packs/day: 0.25    Years: 5.00    Pack years: 1.25    Types: Cigarettes    Start date: 03/01/2009  . Smokeless tobacco: Never Used  Substance Use Topics  . Alcohol use: Yes    Comment: weekends  . Drug use: No     Allergies   Patient has no known  allergies.   Review of Systems Review of Systems  Constitutional: Negative for chills and fever.  HENT: Negative for trouble swallowing.   Musculoskeletal: Positive for arthralgias (pain and swelling of the right foot) and joint swelling. Negative for neck pain.  Skin: Negative for color change and wound.  Neurological: Negative for dizziness and weakness.     Physical Exam Updated Vital Signs BP (!) 139/94 (BP Location: Left Arm)   Pulse 69   Temp 98.3 F (36.8 C) (Oral)   Resp 14   Ht 5\' 2"  (1.575 m)   Wt 57.2 kg   SpO2 100%   BMI 23.05 kg/m   Physical Exam Vitals signs and nursing note reviewed.  Constitutional:      General: He is not in acute distress.    Appearance: Normal appearance. He is well-developed.  HENT:     Head:     Comments: Healing abrasion with mild swelling to the right lower lip.  No drainage or erythema.      Mouth/Throat:     Mouth: Mucous membranes are moist.  Eyes:     Extraocular Movements: Extraocular movements intact.     Pupils: Pupils are equal, round, and reactive to light.  Cardiovascular:  Rate and Rhythm: Normal rate and regular rhythm.     Pulses: Normal pulses.  Pulmonary:     Effort: Pulmonary effort is normal.     Breath sounds: Normal breath sounds.  Musculoskeletal:        General: Swelling and tenderness present.     Comments: Localized ttp and mild to moderate edema noted to the dorsal right foot.  No bony deformity.  Right ankle is non-tender.  No proximal tenderness and compartments are soft.  Skin:    General: Skin is warm and dry.     Capillary Refill: Capillary refill takes less than 2 seconds.  Neurological:     General: No focal deficit present.     Mental Status: He is alert.     Sensory: No sensory deficit.     Motor: No weakness or abnormal muscle tone.      ED Treatments / Results  Labs (all labs ordered are listed, but only abnormal results are displayed) Labs Reviewed - No data to display  EKG  None  Radiology Dg Ankle Complete Right  Result Date: 04/02/2019 CLINICAL DATA:  Right foot and ankle fall since a fall 3 days ago. EXAM: RIGHT ANKLE - COMPLETE 3+ VIEW COMPARISON:  None. FINDINGS: The bones of the ankle are normal. No ankle joint effusion. There are small avulsions from the dorsal aspect of the navicular and from the dorsal aspect of the distal talus. IMPRESSION: 1. Normal ankle. 2. Small avulsions from the dorsal aspect of the navicular and from the dorsal aspect of the distal talus. Electronically Signed   By: Francene BoyersJames  Maxwell M.D.   On: 04/02/2019 12:56   Dg Foot Complete Right  Result Date: 04/02/2019 CLINICAL DATA:  Right foot and ankle pain secondary to a fall 3 days ago. EXAM: RIGHT FOOT COMPLETE - 3+ VIEW COMPARISON:  None. FINDINGS: There are small avulsions of bone from the dorsal aspect of the navicular and from the dorsal aspect of the distal talus. Slight bunion formation on the head of the first metatarsal. The other bones of the right foot are normal. IMPRESSION: Small avulsions from the dorsal aspect of the navicular and from the dorsal aspect of the distal talus. Electronically Signed   By: Francene BoyersJames  Maxwell M.D.   On: 04/02/2019 12:57     Procedures Procedures (including critical care time)  Medications Ordered in ED Medications - No data to display   Initial Impression / Assessment and Plan / ED Course  I have reviewed the triage vital signs and the nursing notes.  Pertinent labs & imaging results that were available during my care of the patient were reviewed by me and considered in my medical decision making (see chart for details).        SPLINT APPLICATION Date/Time: 1:30 PM Authorized by: Lexy Meininger Consent: Verbal consent obtained. Risks and benefits: risks, benefits and alternatives were discussed Consent given by: patient Splint applied by: nursing staff Location details: right foot Splint type: short leg Supplies used: orthoglass, ACE  wrap Post-procedure: The splinted body part was neurovascularly unchanged following the procedure. Patient tolerance: Patient tolerated the procedure well with no immediate complications.  Pt with foot injury likely secondary to a fall earlier in the week.  Discussed XR findings with pt.  No open wounds of the foot.  He agrees to close orthopedic f/u, referral info provided and crutches given.  Remains NV intact.     Final Clinical Impressions(s) / ED Diagnoses   Final diagnoses:  Closed avulsion fracture of navicular bone of right foot, initial encounter  Closed nondisplaced avulsion fracture of right talus, initial encounter    ED Discharge Orders    None       Pauline Ausriplett, Ivy Puryear, PA-C 04/03/19 [id number]    Vanetta MuldersZackowski, Scott, MD 04/11/19 [id number]

## 2019-04-05 ENCOUNTER — Telehealth: Payer: Self-pay | Admitting: Orthopedic Surgery

## 2019-04-05 NOTE — Telephone Encounter (Signed)
Call received from patient's mother, relays that patient was seen at [facility] emergency room for fracture of foot. Said patient was not with her at the time. I did not locate designated party release form on file, and due to patient over age 28, said she will have him call back to schedule.

## 2019-04-07 ENCOUNTER — Ambulatory Visit: Payer: BC Managed Care – PPO | Admitting: Orthopedic Surgery

## 2019-04-07 ENCOUNTER — Encounter: Payer: Self-pay | Admitting: Orthopedic Surgery

## 2019-04-07 ENCOUNTER — Other Ambulatory Visit: Payer: Self-pay

## 2019-04-07 VITALS — BP 129/71 | HR 72 | Temp 97.6°F | Ht 61.0 in | Wt 125.0 lb

## 2019-04-07 DIAGNOSIS — S92251A Displaced fracture of navicular [scaphoid] of right foot, initial encounter for closed fracture: Secondary | ICD-10-CM

## 2019-04-07 MED ORDER — HYDROCODONE-ACETAMINOPHEN 5-325 MG PO TABS: 1.0000 | ORAL_TABLET | Freq: Four times a day (QID) | ORAL | 0 refills | Status: AC | PRN

## 2019-04-07 NOTE — Patient Instructions (Signed)
No work for 2 weeks  Weight-bear as tolerated

## 2019-04-07 NOTE — Progress Notes (Signed)
Pedro Ortiz  04/07/2019  HISTORY SECTION :  Chief Complaint  Patient presents with  . Foot Injury    right foot pain fell down flight of stairs 03/30/19   HPI The patient presents for evaluation of right foot injury August 4 pain right foot dorsum dull mild swelling Review of Systems  All other systems reviewed and are negative.    History reviewed. No pertinent past medical history.  No major medical problems such as heart disease diabetes or hypertension  History reviewed. No pertinent surgical history.  No history of previous surgery   No Known Allergies   Current Outpatient Medications:  .  amoxicillin (AMOXIL) 250 MG capsule, TK ONE C PO QID TAT, Disp: , Rfl:  .  chlorhexidine (PERIDEX) 0.12 % solution, RINSE BID FOR 1 MINUTE, Disp: , Rfl:  .  ibuprofen (ADVIL) 800 MG tablet, TK 1 T PO Q 6 H PRF PAIN, Disp: , Rfl:  .  HYDROcodone-acetaminophen (NORCO/VICODIN) 5-325 MG tablet, Take 1 tablet by mouth every 6 (six) hours as needed for up to 7 days for moderate pain., Disp: 28 tablet, Rfl: 0   PHYSICAL EXAM SECTION: 1) BP 129/71   Pulse 72   Ht 5\' 1"  (1.549 m)   Wt 125 lb (56.7 kg)   BMI 23.62 kg/m   Body mass index is 23.62 kg/m. General appearance: Well-developed well-nourished no gross deformities  2) Cardiovascular normal pulse and perfusion the extremities normal color without edema  3) Neurologically deep tendon reflexes are equal and normal, no sensation loss or deficits no pathologic reflexes  4) Psychological: Awake alert and oriented x3 mood and affect normal  5) Skin no lacerations or ulcerations no nodularity no palpable masses, no erythema or nodularity  6) Musculoskeletal:   Right foot severe pes planus chronic stable ankle tenderness over the dorsum of the foot near where the bone looks fractured on x-ray No atrophy or tremor Skin intact   MEDICAL DECISION SECTION:  Encounter Diagnosis  Name Primary?  . Closed avulsion fracture of  navicular bone of right foot, initial encounter Yes    Imaging Ankle film and 3 views of the foot are also reviewed.  I see dorsal avulsion fractures near the talus and the navicular seems to be more navicular based on clinical exam  Plan:  (Rx., Inj., surg., Frx, MRI/CT, XR:2)  Weight-bear as tolerated Short cam walker  Refill hydrocodone for 7 days  Work note out of work 2 weeks  Follow-up 2 weeks  9:46 AM Arther Abbott, MD  04/07/2019

## 2019-04-22 ENCOUNTER — Encounter: Payer: Self-pay | Admitting: Orthopedic Surgery

## 2019-04-22 ENCOUNTER — Ambulatory Visit (INDEPENDENT_AMBULATORY_CARE_PROVIDER_SITE_OTHER): Payer: BC Managed Care – PPO | Admitting: Orthopedic Surgery

## 2019-04-22 ENCOUNTER — Other Ambulatory Visit: Payer: Self-pay

## 2019-04-22 VITALS — Temp 98.6°F

## 2019-04-22 DIAGNOSIS — S92251A Displaced fracture of navicular [scaphoid] of right foot, initial encounter for closed fracture: Secondary | ICD-10-CM

## 2019-04-22 NOTE — Progress Notes (Signed)
Chief Complaint  Patient presents with  . Foot Pain    right foot fracture, August 4th, patient improving, still has some pain with WB    Status post injury right foot avulsion navicular placed in cam walker doing well foot looks normal today no tenderness no swelling he has chronic pes planus  Recommend return to work September 2 continue the boot until he feels comfortable walking in the house  Return to work noticed restrictions

## 2019-04-22 NOTE — Patient Instructions (Signed)
RETURN TO WORK SEPT 2   NO RESTRICTIONS
